# Patient Record
Sex: Female | Born: 1995 | Race: Black or African American | Hispanic: No | Marital: Single | Smoking: Never smoker
Health system: Southern US, Community
[De-identification: ages and names within clinical notes are randomized; demographics above are authoritative.]

---

## 2017-07-26 ENCOUNTER — Encounter (HOSPITAL_COMMUNITY): Payer: Self-pay | Admitting: Emergency Medicine

## 2017-07-26 DIAGNOSIS — R109 Unspecified abdominal pain: Secondary | ICD-10-CM | POA: Insufficient documentation

## 2017-07-26 DIAGNOSIS — Z5321 Procedure and treatment not carried out due to patient leaving prior to being seen by health care provider: Secondary | ICD-10-CM | POA: Insufficient documentation

## 2017-07-26 NOTE — ED Triage Notes (Signed)
Pt reports RUQ abd pain X1 day with nausea. Intermittent, sharp, 10/10. Denies GU S/S.

## 2017-07-27 ENCOUNTER — Emergency Department (HOSPITAL_COMMUNITY)
Admission: EM | Admit: 2017-07-27 | Discharge: 2017-07-27 | Discharge status: Against Medical Advice | Payer: BLUE CROSS/BLUE SHIELD | Attending: Emergency Medicine | Admitting: Emergency Medicine

## 2017-07-27 ENCOUNTER — Encounter (HOSPITAL_COMMUNITY): Payer: Self-pay | Admitting: Emergency Medicine

## 2017-07-27 ENCOUNTER — Emergency Department (HOSPITAL_COMMUNITY): Payer: BLUE CROSS/BLUE SHIELD

## 2017-07-27 ENCOUNTER — Emergency Department (HOSPITAL_COMMUNITY)
Admission: EM | Admit: 2017-07-27 | Discharge: 2017-07-28 | Disposition: A | Discharge status: Home / Self Care | Payer: BLUE CROSS/BLUE SHIELD | Attending: Emergency Medicine | Admitting: Emergency Medicine

## 2017-07-27 DIAGNOSIS — R1031 Right lower quadrant pain: Secondary | ICD-10-CM

## 2017-07-27 DIAGNOSIS — R1011 Right upper quadrant pain: Secondary | ICD-10-CM | POA: Diagnosis present

## 2017-07-27 DIAGNOSIS — K529 Noninfective gastroenteritis and colitis, unspecified: Secondary | ICD-10-CM

## 2017-07-27 LAB — COMPREHENSIVE METABOLIC PANEL
ALK PHOS: 58 U/L (ref 38–126)
ALK PHOS: 64 U/L (ref 38–126)
ALT: 11 U/L — AB (ref 14–54)
ALT: 12 U/L — AB (ref 14–54)
ANION GAP: 11 (ref 5–15)
AST: 16 U/L (ref 15–41)
AST: 22 U/L (ref 15–41)
Albumin: 3.9 g/dL (ref 3.5–5.0)
Albumin: 4 g/dL (ref 3.5–5.0)
Anion gap: 13 (ref 5–15)
BILIRUBIN TOTAL: 1.4 mg/dL — AB (ref 0.3–1.2)
BUN: 13 mg/dL (ref 6–20)
BUN: 14 mg/dL (ref 6–20)
CALCIUM: 8.9 mg/dL (ref 8.9–10.3)
CALCIUM: 9.1 mg/dL (ref 8.9–10.3)
CO2: 20 mmol/L — AB (ref 22–32)
CO2: 24 mmol/L (ref 22–32)
CREATININE: 0.87 mg/dL (ref 0.44–1.00)
CREATININE: 0.89 mg/dL (ref 0.44–1.00)
Chloride: 100 mmol/L — ABNORMAL LOW (ref 101–111)
Chloride: 103 mmol/L (ref 101–111)
GFR calc Af Amer: >60 mL/min (ref >60)
GFR calc non Af Amer: >60 mL/min (ref >60)
GFR calc non Af Amer: >60 mL/min (ref >60)
Glucose, Bld: 122 mg/dL — ABNORMAL HIGH (ref 65–99)
Glucose, Bld: 98 mg/dL (ref 65–99)
Potassium: 3.8 mmol/L (ref 3.5–5.1)
Potassium: 3.9 mmol/L (ref 3.5–5.1)
SODIUM: 135 mmol/L (ref 135–145)
Sodium: 136 mmol/L (ref 135–145)
TOTAL PROTEIN: 7.8 g/dL (ref 6.5–8.1)
Total Bilirubin: 0.8 mg/dL (ref 0.3–1.2)
Total Protein: 7.4 g/dL (ref 6.5–8.1)

## 2017-07-27 LAB — URINALYSIS, ROUTINE W REFLEX MICROSCOPIC
BACTERIA UA: NONE SEEN
Bilirubin Urine: NEGATIVE
Glucose, UA: NEGATIVE mg/dL
Glucose, UA: NEGATIVE mg/dL
KETONES UR: NEGATIVE mg/dL
Ketones, ur: 20 mg/dL — AB
Nitrite: NEGATIVE
Nitrite: NEGATIVE
PH: 6 (ref 5.0–8.0)
PH: 6 (ref 5.0–8.0)
Protein, ur: NEGATIVE mg/dL
Protein, ur: 30 mg/dL — AB
SPECIFIC GRAVITY, URINE: 1.028 (ref 1.005–1.030)
SPECIFIC GRAVITY, URINE: 1.032 — AB (ref 1.005–1.030)

## 2017-07-27 LAB — CBC
HCT: 38.9 % (ref 36.0–46.0)
HCT: 39.6 % (ref 36.0–46.0)
Hemoglobin: 12.7 g/dL (ref 12.0–15.0)
Hemoglobin: 12.8 g/dL (ref 12.0–15.0)
MCH: 27.6 pg (ref 26.0–34.0)
MCH: 28.1 pg (ref 26.0–34.0)
MCHC: 32.3 g/dL (ref 30.0–36.0)
MCHC: 32.6 g/dL (ref 30.0–36.0)
MCV: 85.3 fL (ref 78.0–100.0)
MCV: 86.1 fL (ref 78.0–100.0)
PLATELETS: 348 10*3/uL (ref 150–400)
PLATELETS: 358 10*3/uL (ref 150–400)
RBC: 4.52 MIL/uL (ref 3.87–5.11)
RBC: 4.64 MIL/uL (ref 3.87–5.11)
RDW: 12.8 % (ref 11.5–15.5)
RDW: 12.9 % (ref 11.5–15.5)
WBC: 13.9 10*3/uL — AB (ref 4.0–10.5)
WBC: 8.8 10*3/uL (ref 4.0–10.5)

## 2017-07-27 LAB — I-STAT BETA HCG BLOOD, ED (MC, WL, AP ONLY)
I-stat hCG, quantitative: <5 m[IU]/mL (ref <5)
I-stat hCG, quantitative: <5 m[IU]/mL (ref <5)

## 2017-07-27 LAB — LIPASE, BLOOD
LIPASE: 23 U/L (ref 11–51)
Lipase: 25 U/L (ref 11–51)

## 2017-07-27 MED ORDER — IOPAMIDOL (ISOVUE-300) INJECTION 61%
INTRAVENOUS | Status: AC
  Administered 2017-07-27: 100 mL
  Filled 2017-07-27: qty 100

## 2017-07-27 MED ORDER — IOPAMIDOL (ISOVUE-300) INJECTION 61%
INTRAVENOUS | Status: AC
  Administered 2017-07-27: 75 mL
  Filled 2017-07-27: qty 30

## 2017-07-27 NOTE — ED Notes (Signed)
ED Provider at bedside. 

## 2017-07-27 NOTE — ED Triage Notes (Signed)
Patient complains of sharp right sided abdominal pain that started three days ago.  Reports nausea yesterday but states she went to sleep and upon waking the nausea had resolved. No other complaints at this time.

## 2017-07-27 NOTE — ED Notes (Signed)
Pt states that she feels better and is leaving due to wait times

## 2017-07-27 NOTE — ED Provider Notes (Signed)
[facility] Provider Note   CSN: [id number] Arrival date & time: 07/27/17  1501     History   Chief Complaint Chief Complaint  Patient presents with  . Abdominal Pain    HPI Alejandra Cortez is a 22 y.o. female.  HPI    22 year old female presents today with complaints of abdominal pain.  Patient notes 2 days ago she developed right upper and right lower abdominal pain.  She reports pain is worse with movement or palpation, not worse with eating or drinking.  She denies any fever or vomiting noting she did have some nausea yesterday.  She notes she was able to drink water today without significant discomfort but found this unappealing.  She denies any vaginal bleeding or discharge.  She denies any abdominal surgeries.  Patient denies any urinary changes.   History reviewed. No pertinent past medical history.  There are no active problems to display for this patient.   History reviewed. No pertinent surgical history.  OB History    No data available       Home Medications    Prior to Admission medications   Medication Sig Start Date End Date Taking? Authorizing Provider  ibuprofen (ADVIL,MOTRIN) 200 MG tablet Take 200-400 mg by mouth every 6 (six) hours as needed (for pain or headaches).   Yes [provider]    Family History No family history on file.  Social History Social History   Tobacco Use  . Smoking status: Never Smoker  . Smokeless tobacco: Never Used  Substance Use Topics  . Alcohol use: Yes  . Drug use: No     Allergies   Patient has no known allergies.   Review of Systems Review of Systems  All other systems reviewed and are negative.   Physical Exam Updated Vital Signs BP 117/71   Pulse 79   Temp 97.7 F (36.5 C) (Oral)   Resp 14   LMP 07/05/2017 (Exact Date)   SpO2 100%   Physical Exam  Constitutional: She is oriented to person, place, and time. She appears well-developed and  well-nourished.  HENT:  Head: Normocephalic and atraumatic.  Eyes: Conjunctivae are normal. Pupils are equal, round, and reactive to light. Right eye exhibits no discharge. Left eye exhibits no discharge. No scleral icterus.  Neck: Normal range of motion. No JVD present. No tracheal deviation present.  Pulmonary/Chest: Effort normal. No stridor.  Abdominal:  Tenderness palpation of right upper and right lower abdominal quadrants-remainder of abdominal exam without acute findings.  Neurological: She is alert and oriented to person, place, and time. Coordination normal.  Psychiatric: She has a normal mood and affect. Her behavior is normal. Judgment and thought content normal.  Nursing note and vitals reviewed.    ED Treatments / Results  Labs (all labs ordered are listed, but only abnormal results are displayed) Labs Reviewed  COMPREHENSIVE METABOLIC PANEL - Abnormal; Notable for the following components:      Result Value   CO2 20 (*)    Glucose, Bld 122 (*)    ALT 12 (*)    All other components within normal limits  URINALYSIS, ROUTINE W REFLEX MICROSCOPIC - Abnormal; Notable for the following components:   APPearance HAZY (*)    Specific Gravity, Urine 1.032 (*)    Hgb urine dipstick MODERATE (*)    Bilirubin Urine SMALL (*)    Protein, ur 30 (*)    Leukocytes, UA TRACE (*)    Bacteria, UA  RARE (*)    Squamous Epithelial / LPF 6-30 (*)    All other components within normal limits  LIPASE, BLOOD  CBC  I-STAT BETA HCG BLOOD, ED ([facility], [facility], [facility] ONLY)    EKG  EKG Interpretation None       Radiology Ct Abdomen Pelvis W Contrast  Result Date: 07/27/2017 CLINICAL DATA:  22 year old female with abdominal pain. EXAM: CT ABDOMEN AND PELVIS WITH CONTRAST TECHNIQUE: Multidetector CT imaging of the abdomen and pelvis was performed using the standard protocol following bolus administration of intravenous contrast. CONTRAST:  ISOVUE-300 IOPAMIDOL (ISOVUE-300) INJECTION 61%  COMPARISON:  None. FINDINGS: Lower chest: The visualized lung bases are clear. No intra-abdominal free air.  Trace free fluid within the pelvis. Hepatobiliary: No focal liver abnormality is seen. No gallstones, gallbladder wall thickening, or biliary dilatation. Pancreas: Unremarkable. No pancreatic ductal dilatation or surrounding inflammatory changes. Spleen: Normal in size without focal abnormality. Adrenals/Urinary Tract: Adrenal glands are unremarkable. Kidneys are normal, without renal calculi, focal lesion, or hydronephrosis. Bladder is unremarkable. Stomach/Bowel: Evaluation of the bowel is limited in the absence of oral contrast. A ago okay thank hemorrhage there is diffuse inflammatory changes within the pelvis with thickened and inflamed appearance of the rectosigmoid as well as thickened appearance of the cecum. Evaluation and delineation of the bowel is limited due to proximity and abutment of loops of small and large bowel within the pelvis as well as inflammatory changes. The appendix is not identified with certainty and acute appendicitis is not entirely excluded. There is a focal area of apparent discontinuity of the bowel wall within the pelvis (series 3 image 59 and coronal series 6 image 87). Delayed CT of the pelvis with oral contrast after opacification of the cecum is recommended for further evaluation. There is no evidence of bowel obstruction. Vascular/Lymphatic: No significant vascular findings are present. No enlarged abdominal or pelvic lymph nodes. Reproductive: The uterus is anteverted and grossly unremarkable. The ovaries are grossly unremarkable as well as visualized. Pelvic ultrasound may provide better evaluation. Other: None Musculoskeletal: No acute or significant osseous findings. IMPRESSION: Inflammatory changes of rectosigmoid and cecum within the pelvis with possible area of discontinuity of the wall of the cecum. Evaluation of this bowel loop is very limited in the absence of  oral contrast. The appendix is not identified with certainty. CT of the pelvis with oral contrast and delayed images with opacification of the colon is recommended for further evaluation. Electronically Signed   By: Elgie Collard M.D.   On: 07/27/2017 22:16    Procedures Procedures (including critical care time)  Medications Ordered in ED Medications  iopamidol (ISOVUE-300) 61 % injection (100 mLs  Contrast Given 07/27/17 2123)     Initial Impression / Assessment and Plan / ED Course  I have reviewed the triage vital signs and the nursing notes.  Pertinent labs & imaging results that were available during my care of the patient were reviewed by me and considered in my medical decision making (see chart for details).      Final Clinical Impressions(s) / ED Diagnoses   Final diagnoses:  Right lower quadrant abdominal pain    22 year old female presents today with abdominal pain.  This is both right upper and right lower.  She is afebrile with a normal white count.  CT scan results requiring further evaluation oral contrast.  Will patient will have CT with oral, she will have a follow-up with oncoming provider pending CT results and further disposition.  ED  Discharge Orders    None       Rosalio Loud 07/27/17 2227    Vanetta Mulders, MD 07/28/17 [phone]

## 2017-07-28 ENCOUNTER — Emergency Department (HOSPITAL_COMMUNITY): Payer: BLUE CROSS/BLUE SHIELD

## 2017-07-28 MED ORDER — HYDROMORPHONE HCL 1 MG/ML IJ SOLN
0.5000 mg | Freq: Once | INTRAMUSCULAR | Status: AC
  Administered 2017-07-28: 0.5 mg via INTRAVENOUS
  Filled 2017-07-28: qty 1

## 2017-07-28 MED ORDER — AMOXICILLIN-POT CLAVULANATE 875-125 MG PO TABS: 1.0000 | ORAL_TABLET | Freq: Two times a day (BID) | ORAL | 0 refills | Status: AC

## 2017-07-28 MED ORDER — ONDANSETRON HCL 4 MG/2ML IJ SOLN
4.0000 mg | Freq: Once | INTRAMUSCULAR | Status: AC
  Administered 2017-07-28: 4 mg via INTRAVENOUS
  Filled 2017-07-28: qty 2

## 2017-07-28 MED ORDER — PROMETHAZINE HCL 12.5 MG PO TABS: 12.5000 mg | ORAL_TABLET | Freq: Four times a day (QID) | ORAL | 0 refills | Status: AC | PRN

## 2017-07-28 NOTE — ED Notes (Signed)
ED Provider at bedside. 

## 2017-07-28 NOTE — ED Provider Notes (Signed)
THIS IS A SHARED VISIT WITH JEFF HEDGES, PAC BP 101/64   Pulse 63   Temp 97.7 F (36.5 C) (Oral)   Resp 14   LMP 07/05/2017 (Exact Date)   SpO2 100%   Patient examined, tender with palpation of right upper and right lower quadrant. No guarding or rebound.   Results for orders placed or performed during the hospital encounter of 07/27/17 (from the past 24 hour(s))  Lipase, blood     Status: None   Collection Time: 07/27/17  3:18 PM  Result Value Ref Range   Lipase 23 11 - 51 U/L  Comprehensive metabolic panel     Status: Abnormal   Collection Time: 07/27/17  3:18 PM  Result Value Ref Range   Sodium 136 135 - 145 mmol/L   Potassium 3.9 3.5 - 5.1 mmol/L   Chloride 103 101 - 111 mmol/L   CO2 20 (L) 22 - 32 mmol/L   Glucose, Bld 122 (H) 65 - 99 mg/dL   BUN 13 6 - 20 mg/dL   Creatinine, Ser 0.860.89 0.44 - 1.00 mg/dL   Calcium 8.9 8.9 - 57.810.3 mg/dL   Total Protein 7.4 6.5 - 8.1 g/dL   Albumin 3.9 3.5 - 5.0 g/dL   AST 22 15 - 41 U/L   ALT 12 (L) 14 - 54 U/L   Alkaline Phosphatase 58 38 - 126 U/L   Total Bilirubin 0.8 0.3 - 1.2 mg/dL   GFR calc non Af Amer >60 >60 mL/min   GFR calc Af Amer >60 >60 mL/min   Anion gap 13 5 - 15  CBC     Status: None   Collection Time: 07/27/17  3:18 PM  Result Value Ref Range   WBC 8.8 4.0 - 10.5 K/uL   RBC 4.52 3.87 - 5.11 MIL/uL   Hemoglobin 12.7 12.0 - 15.0 g/dL   HCT 46.938.9 62.936.0 - 52.846.0 %   MCV 86.1 78.0 - 100.0 fL   MCH 28.1 26.0 - 34.0 pg   MCHC 32.6 30.0 - 36.0 g/dL   RDW 41.312.9 24.411.5 - 01.015.5 %   Platelets 348 150 - 400 K/uL  Urinalysis, Routine w reflex microscopic     Status: Abnormal   Collection Time: 07/27/17  3:18 PM  Result Value Ref Range   Color, Urine YELLOW YELLOW   APPearance HAZY (A) CLEAR   Specific Gravity, Urine 1.032 (H) 1.005 - 1.030   pH 6.0 5.0 - 8.0   Glucose, UA NEGATIVE NEGATIVE mg/dL   Hgb urine dipstick MODERATE (A) NEGATIVE   Bilirubin Urine SMALL (A) NEGATIVE   Ketones, ur NEGATIVE NEGATIVE mg/dL   Protein, ur 30  (A) NEGATIVE mg/dL   Nitrite NEGATIVE NEGATIVE   Leukocytes, UA TRACE (A) NEGATIVE   RBC / HPF 6-30 0 - 5 RBC/hpf   WBC, UA 6-30 0 - 5 WBC/hpf   Bacteria, UA RARE (A) NONE SEEN   Squamous Epithelial / LPF 6-30 (A) NONE SEEN   Mucus PRESENT   I-Stat beta hCG blood, ED     Status: None   Collection Time: 07/27/17  3:29 PM  Result Value Ref Range   I-stat hCG, quantitative <5.0 <5 mIU/mL   Comment 3            Ct Pelvis W Contrast  Result Date: 07/28/2017 CLINICAL DATA:  22 year old female with abdominal pain. EXAM: CT PELVIS WITH CONTRAST TECHNIQUE: Multidetector CT imaging of the pelvis was performed using the standard protocol following the bolus administration of  intravenous contrast. CONTRAST:  75mL ISOVUE-300 IOPAMIDOL (ISOVUE-300) INJECTION 61% COMPARISON:  Abdominal CT dated 07/27/2017 FINDINGS: Urinary Tract: In the visualized distal ureters and urinary bladder appear unremarkable. Bowel: There is inflammatory changes and thickening of the visualized portion of the descending colon and sigmoid consistent with colitis. Clinical correlation is recommended. There is thickening and inflammatory changes of the cecum within the pelvis likely reactive to inflammatory changes of the sigmoid colon. The base of the appendix is not well visualized. There is probable thickening of the base of the appendix reactive to surrounding inflammatory changes. Contrast however opacifies the mid to distal portion of the appendix which appear unremarkable. No definite abscess noted. Vascular/Lymphatic: No pathologically enlarged lymph nodes. No significant vascular abnormality seen. Reproductive: The uterus and ovaries are grossly unremarkable. A 2.2 x 1.5 cm low attenuating area in the left hemipelvis in the region of the left adnexa may represent ovarian tissue or follicle. Other:  None Musculoskeletal: No suspicious bone lesions identified. IMPRESSION: Findings most consistent with colitis. There is probable  thickening of the base of the appendix as well as inflammation of the cecum reactive to inflammatory changes of the distal colon. Clinical correlation and follow-up as clinically indicated. No discrete drainable fluid collection or abscess identified. Electronically Signed   By: Elgie Collard M.D.   On: 07/28/2017 01:43   Ct Abdomen Pelvis W Contrast  Result Date: 07/27/2017 CLINICAL DATA:  22 year old female with abdominal pain. EXAM: CT ABDOMEN AND PELVIS WITH CONTRAST TECHNIQUE: Multidetector CT imaging of the abdomen and pelvis was performed using the standard protocol following bolus administration of intravenous contrast. CONTRAST:  ISOVUE-300 IOPAMIDOL (ISOVUE-300) INJECTION 61% COMPARISON:  None. FINDINGS: Lower chest: The visualized lung bases are clear. No intra-abdominal free air.  Trace free fluid within the pelvis. Hepatobiliary: No focal liver abnormality is seen. No gallstones, gallbladder wall thickening, or biliary dilatation. Pancreas: Unremarkable. No pancreatic ductal dilatation or surrounding inflammatory changes. Spleen: Normal in size without focal abnormality. Adrenals/Urinary Tract: Adrenal glands are unremarkable. Kidneys are normal, without renal calculi, focal lesion, or hydronephrosis. Bladder is unremarkable. Stomach/Bowel: Evaluation of the bowel is limited in the absence of oral contrast. A ago okay thank hemorrhage there is diffuse inflammatory changes within the pelvis with thickened and inflamed appearance of the rectosigmoid as well as thickened appearance of the cecum. Evaluation and delineation of the bowel is limited due to proximity and abutment of loops of small and large bowel within the pelvis as well as inflammatory changes. The appendix is not identified with certainty and acute appendicitis is not entirely excluded. There is a focal area of apparent discontinuity of the bowel wall within the pelvis (series 3 image 59 and coronal series 6 image 87). Delayed  CT of the pelvis with oral contrast after opacification of the cecum is recommended for further evaluation. There is no evidence of bowel obstruction. Vascular/Lymphatic: No significant vascular findings are present. No enlarged abdominal or pelvic lymph nodes. Reproductive: The uterus is anteverted and grossly unremarkable. The ovaries are grossly unremarkable as well as visualized. Pelvic ultrasound may provide better evaluation. Other: None Musculoskeletal: No acute or significant osseous findings. IMPRESSION: Inflammatory changes of rectosigmoid and cecum within the pelvis with possible area of discontinuity of the wall of the cecum. Evaluation of this bowel loop is very limited in the absence of oral contrast. The appendix is not identified with certainty. CT of the pelvis with oral contrast and delayed images with opacification of the colon is recommended for  further evaluation. Electronically Signed   By: Elgie Collard M.D.   On: 07/27/2017 22:16    I discussed this case with Dr. Nicanor Alcon 2:35 am consult with general surgery does not feel this is appendicitis but is colitis.  Will teat with Augmentin for colitis and referral to GI.   Patient appears stable for d/c .    Kerrie [place], [place] 07/28/17 0251    Cy Blamer, MD 07/28/17 [phone]

## 2017-07-28 NOTE — ED Notes (Signed)
Patient transported to CT 

## 2017-07-28 NOTE — Discharge Instructions (Addendum)
It is important that you call the GI doctor for follow up. Return here as needed.

## 2017-07-28 NOTE — ED Notes (Signed)
Pt departed in NAD, refused use of wheelchair.  

## 2017-07-28 NOTE — ED Notes (Signed)
Pt requesting pain meds

## 2018-10-18 IMAGING — CT CT PELVIS W/ CM
2 of 3 series · 16 of 46 positions shown, 18 images · IV contrast (Omni 300)
Comparison: Abdominal CT dated 07/27/2017

CLINICAL DATA: 22-year-old female with abdominal pain.

EXAM:
CT PELVIS WITH CONTRAST
TECHNIQUE: Multidetector CT imaging of the pelvis was performed using the
standard protocol following the bolus administration of intravenous
contrast.
CONTRAST:  75mL U77EAH-6YY IOPAMIDOL (U77EAH-6YY) INJECTION 61%

[Series 3: pelvis with 5.0 · axial · 0.58mm/px · z∈[+603,+798]mm · 13 of 45 slices shown, 15 images]
[im 3/45  soft-tissue]
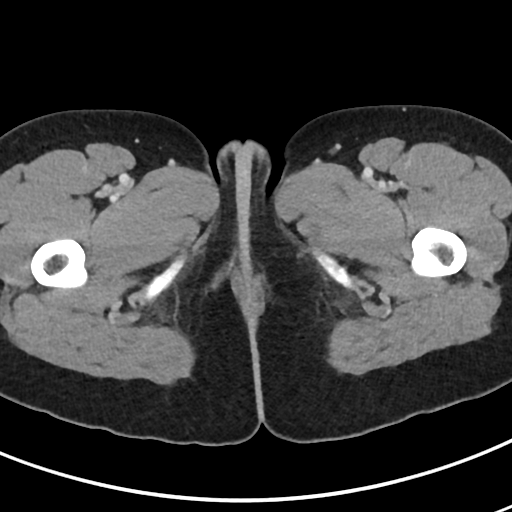
[im 3/45  bone]
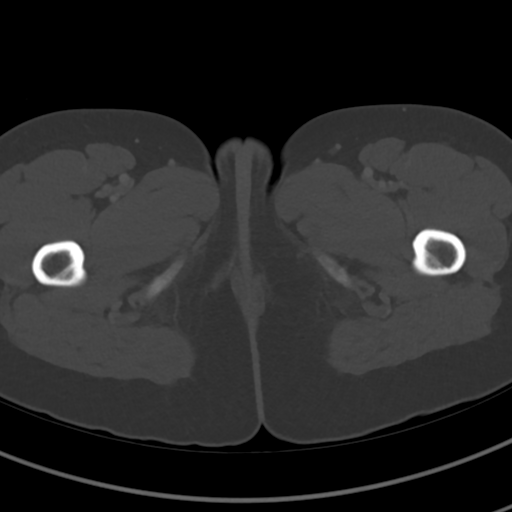
[im 6/45  soft-tissue]
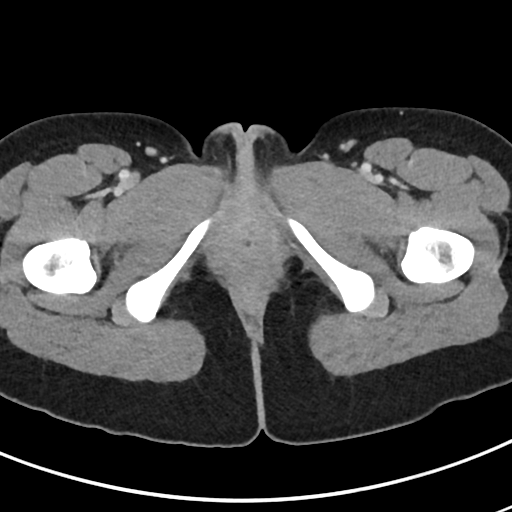
[im 9/45  soft-tissue]
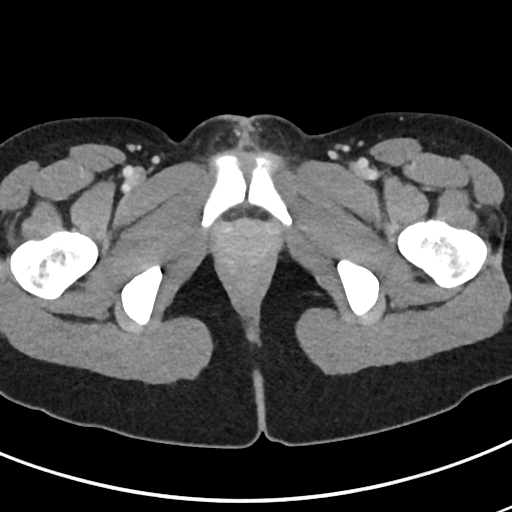
[im 13/45  soft-tissue]
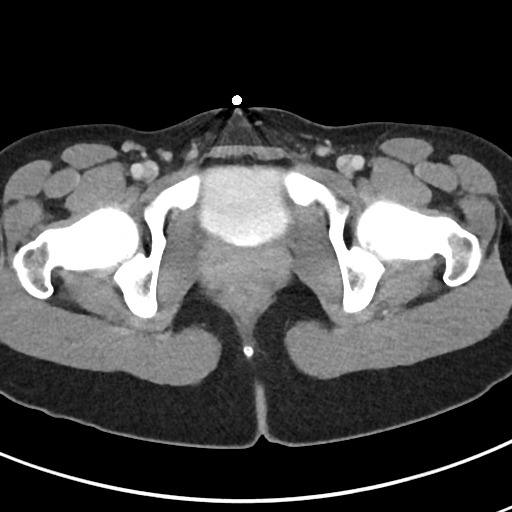
[im 16/45  soft-tissue]
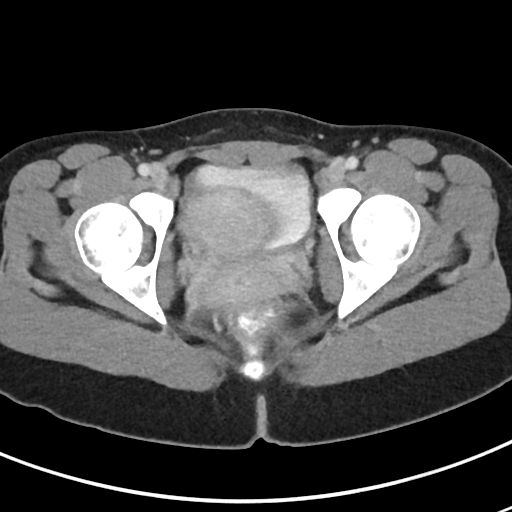
[im 19/45  soft-tissue]
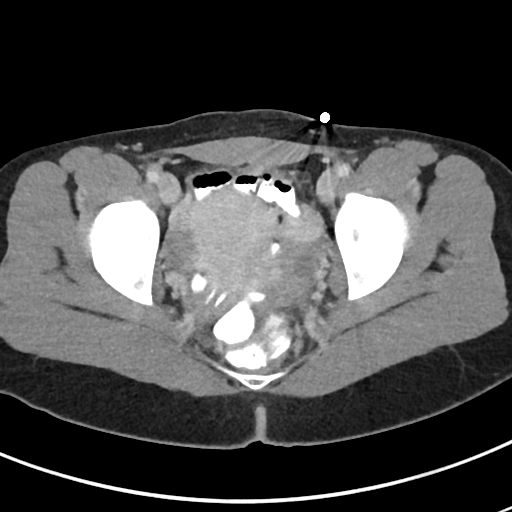
[im 23/45  soft-tissue]
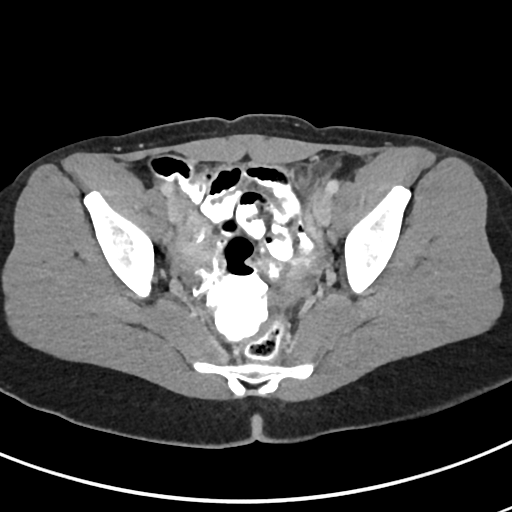
[im 26/45  soft-tissue]
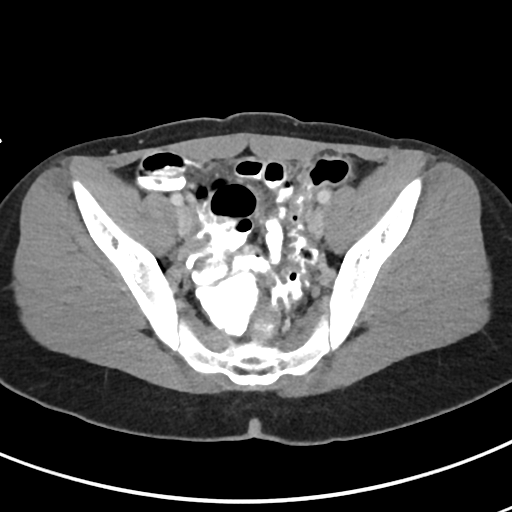
[im 29/45  soft-tissue]
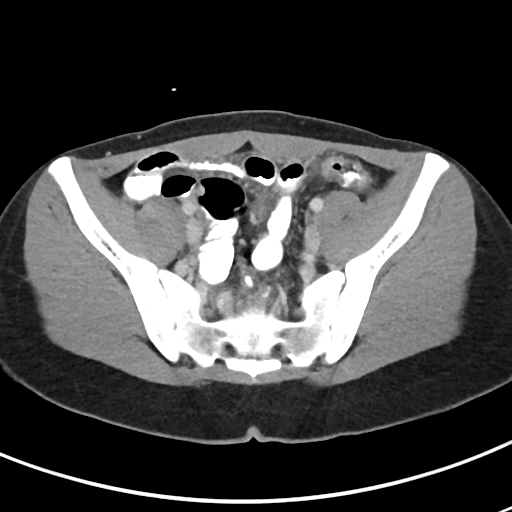
[im 29/45  bone]
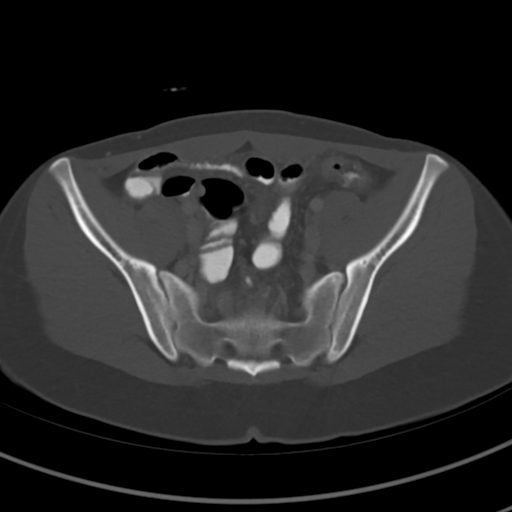
[im 32/45  soft-tissue]
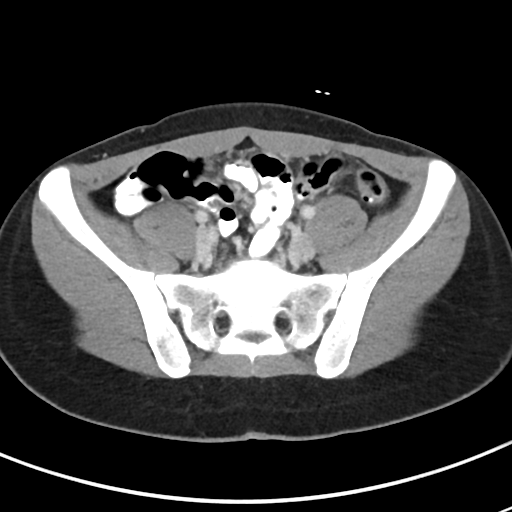
[im 36/45  soft-tissue]
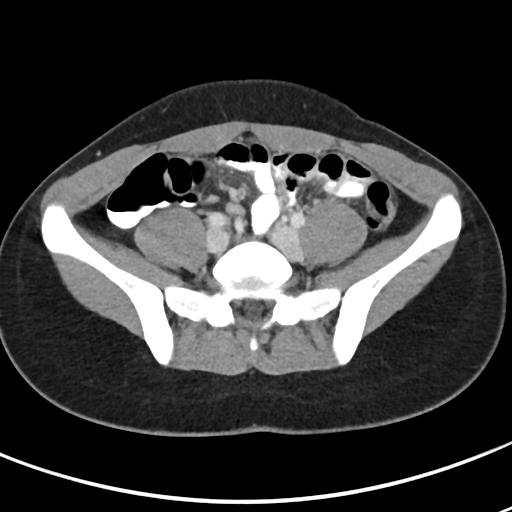
[im 39/45  soft-tissue]
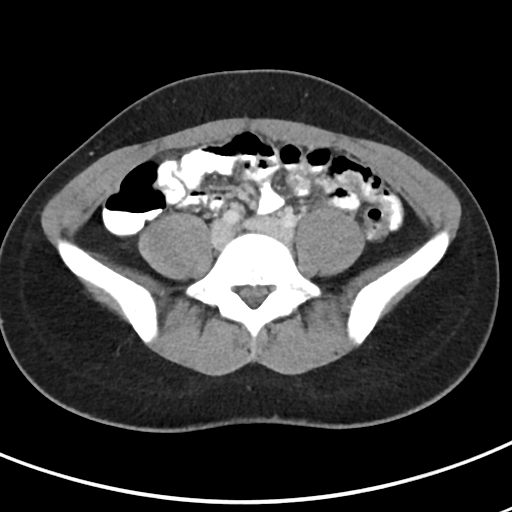
[im 42/45  soft-tissue]
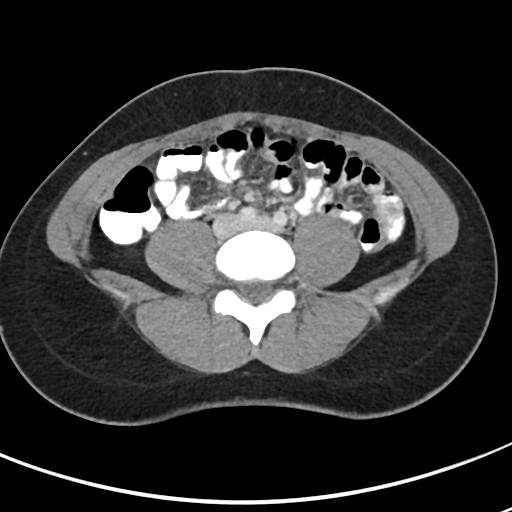

[Series 5: pelvis with 2.0 cor · coronal · 0.45mm/px · 3 of 101 slices shown]
[im 34/101  soft-tissue]
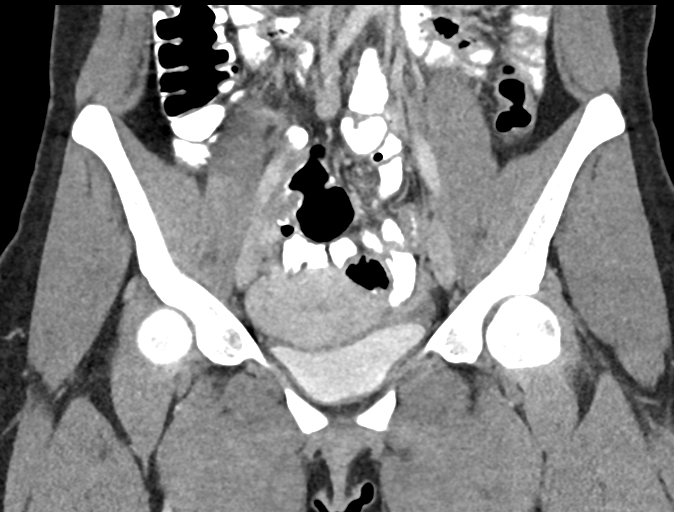
[im 45/101  soft-tissue]
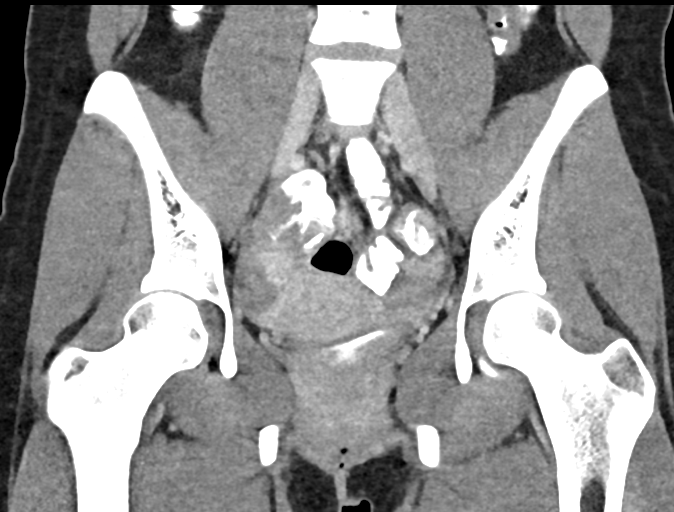
[im 56/101  soft-tissue]
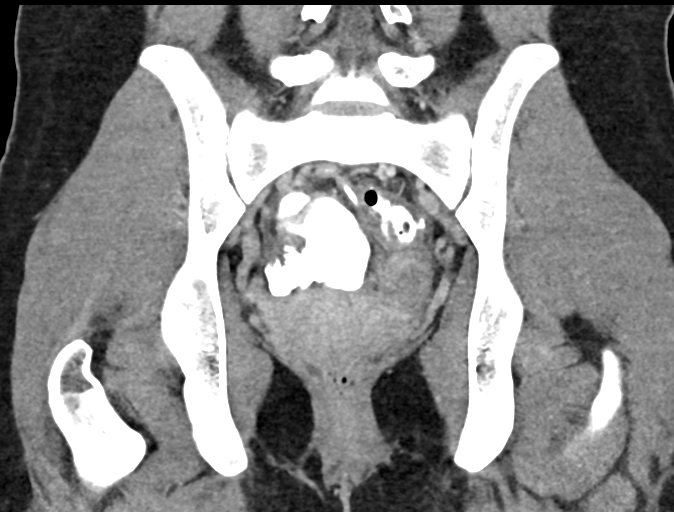

[16 of 46 positions shown; findings below may reference images not displayed]

FINDINGS: Urinary Tract: In the visualized distal ureters and urinary bladder
appear unremarkable.

Bowel: There is inflammatory changes and thickening of the
visualized portion of the descending colon and sigmoid consistent
with colitis. Clinical correlation is recommended. There is
thickening and inflammatory changes of the cecum within the pelvis
likely reactive to inflammatory changes of the sigmoid colon. The
base of the appendix is not well visualized. There is probable
thickening of the base of the appendix reactive to surrounding
inflammatory changes. Contrast however opacifies the mid to distal
portion of the appendix which appear unremarkable. No definite
abscess noted.

Vascular/Lymphatic: No pathologically enlarged lymph nodes. No
significant vascular abnormality seen.

Reproductive: The uterus and ovaries are grossly unremarkable. A
x 1.5 cm low attenuating area in the left hemipelvis in the region
of the left adnexa may represent ovarian tissue or follicle.

Other:  None

Musculoskeletal: No suspicious bone lesions identified.
IMPRESSION: Findings most consistent with colitis. There is probable thickening
of the base of the appendix as well as inflammation of the cecum
reactive to inflammatory changes of the distal colon. Clinical
correlation and follow-up as clinically indicated. No discrete
drainable fluid collection or abscess identified.
